# Patient Record
Sex: Female | Born: 1950 | Race: White | Hispanic: No | Marital: Single | Smoking: Never smoker
Health system: Southern US, Community
[De-identification: ages and names within clinical notes are randomized; demographics above are authoritative.]

## PROBLEM LIST (undated history)

## (undated) DIAGNOSIS — Z9109 Other allergy status, other than to drugs and biological substances: Secondary | ICD-10-CM

---

## 2014-03-22 ENCOUNTER — Encounter (HOSPITAL_BASED_OUTPATIENT_CLINIC_OR_DEPARTMENT_OTHER): Payer: Self-pay | Admitting: Emergency Medicine

## 2014-03-22 ENCOUNTER — Emergency Department (HOSPITAL_BASED_OUTPATIENT_CLINIC_OR_DEPARTMENT_OTHER): Payer: BC Managed Care – PPO

## 2014-03-22 ENCOUNTER — Emergency Department (HOSPITAL_BASED_OUTPATIENT_CLINIC_OR_DEPARTMENT_OTHER)
Admission: EM | Admit: 2014-03-22 | Discharge: 2014-03-23 | Disposition: A | Discharge status: Home / Self Care | Payer: BC Managed Care – PPO | Attending: Emergency Medicine | Admitting: Emergency Medicine

## 2014-03-22 DIAGNOSIS — S93401A Sprain of unspecified ligament of right ankle, initial encounter: Secondary | ICD-10-CM

## 2014-03-22 DIAGNOSIS — Z880 Allergy status to penicillin: Secondary | ICD-10-CM | POA: Insufficient documentation

## 2014-03-22 DIAGNOSIS — W1809XA Striking against other object with subsequent fall, initial encounter: Secondary | ICD-10-CM | POA: Insufficient documentation

## 2014-03-22 DIAGNOSIS — S9030XA Contusion of unspecified foot, initial encounter: Secondary | ICD-10-CM | POA: Insufficient documentation

## 2014-03-22 DIAGNOSIS — S93409A Sprain of unspecified ligament of unspecified ankle, initial encounter: Secondary | ICD-10-CM | POA: Insufficient documentation

## 2014-03-22 DIAGNOSIS — Y929 Unspecified place or not applicable: Secondary | ICD-10-CM | POA: Insufficient documentation

## 2014-03-22 DIAGNOSIS — W102XXA Fall (on)(from) incline, initial encounter: Secondary | ICD-10-CM

## 2014-03-22 DIAGNOSIS — IMO0002 Reserved for concepts with insufficient information to code with codable children: Secondary | ICD-10-CM | POA: Insufficient documentation

## 2014-03-22 DIAGNOSIS — S40019A Contusion of unspecified shoulder, initial encounter: Secondary | ICD-10-CM | POA: Insufficient documentation

## 2014-03-22 DIAGNOSIS — T148XXA Other injury of unspecified body region, initial encounter: Secondary | ICD-10-CM

## 2014-03-22 DIAGNOSIS — W108XXA Fall (on) (from) other stairs and steps, initial encounter: Secondary | ICD-10-CM | POA: Insufficient documentation

## 2014-03-22 DIAGNOSIS — X500XXA Overexertion from strenuous movement or load, initial encounter: Secondary | ICD-10-CM | POA: Insufficient documentation

## 2014-03-22 DIAGNOSIS — Z8709 Personal history of other diseases of the respiratory system: Secondary | ICD-10-CM | POA: Insufficient documentation

## 2014-03-22 DIAGNOSIS — Z79899 Other long term (current) drug therapy: Secondary | ICD-10-CM | POA: Insufficient documentation

## 2014-03-22 DIAGNOSIS — Y9389 Activity, other specified: Secondary | ICD-10-CM | POA: Insufficient documentation

## 2014-03-22 HISTORY — DX: Other allergy status, other than to drugs and biological substances: Z91.09

## 2014-03-22 NOTE — ED Notes (Signed)
Patient transported to X-ray 

## 2014-03-22 NOTE — ED Notes (Signed)
Pt was at daughters wedding dress rehersal, lost balance and fell down 2 steps, hit right side of forehead, denies loc, injured right ankle, bilateral knees, right shoulder

## 2014-03-23 ENCOUNTER — Emergency Department (HOSPITAL_BASED_OUTPATIENT_CLINIC_OR_DEPARTMENT_OTHER): Payer: BC Managed Care – PPO

## 2014-03-23 MED ORDER — ONDANSETRON 8 MG PO TBDP
8.0000 mg | ORAL_TABLET | Freq: Once | ORAL | Status: AC
  Administered 2014-03-23: 8 mg via ORAL
  Filled 2014-03-23: qty 1

## 2014-03-23 MED ORDER — OXYCODONE-ACETAMINOPHEN 5-325 MG PO TABS: 1.0000 | ORAL_TABLET | Freq: Four times a day (QID) | ORAL | Status: AC | PRN

## 2014-03-23 MED ORDER — OXYCODONE-ACETAMINOPHEN 5-325 MG PO TABS
2.0000 | ORAL_TABLET | Freq: Once | ORAL | Status: AC
Start: 2014-03-23 — End: 2014-03-23
  Administered 2014-03-23: 2 via ORAL
  Filled 2014-03-23: qty 2

## 2014-03-23 MED ORDER — NAPROXEN 500 MG PO TABS: 500.0000 mg | ORAL_TABLET | Freq: Two times a day (BID) | ORAL | Status: AC

## 2014-03-23 NOTE — ED Notes (Signed)
MD at bedside. 

## 2014-03-23 NOTE — ED Provider Notes (Signed)
CSN: [id number]     Arrival date & time 03/22/14  2332 History   First MD Initiated Contact with Patient 03/22/14 2358     Chief Complaint  Patient presents with  . Fall     (Consider location/radiation/quality/duration/timing/severity/associated sxs/prior Treatment) Patient is a [age] y.o. female presenting with fall. The history is provided by the patient.  Fall This is a new problem. The current episode started 3 to 5 hours ago. The problem occurs constantly. The problem has not changed since onset.Pertinent negatives include no headaches. Nothing aggravates the symptoms. Nothing relieves the symptoms. She has tried nothing for the symptoms. The treatment provided no relief.  On platform at church for daughter's rehearsal and missed a step and went down striking right shoulder and twisting right ankle.  No LOC no vomiting  Past Medical History  Diagnosis Date  . Environmental allergies    History reviewed. No pertinent past surgical history. History reviewed. No pertinent family history. History  Substance Use Topics  . Smoking status: Never Smoker   . Smokeless tobacco: Not on file  . Alcohol Use: No   OB History   Grav Para Term Preterm Abortions TAB SAB Ect Mult Living                 Review of Systems  Gastrointestinal: Negative for vomiting.  Musculoskeletal: Positive for arthralgias. Negative for back pain and neck pain.  Neurological: Negative for weakness, numbness and headaches.  All other systems reviewed and are negative.     Allergies  Penicillins  Home Medications   Prior to Admission medications   Medication Sig Start Date End Date Taking? Authorizing Provider  cetirizine (ZYRTEC) 10 MG tablet Take 10 mg by mouth daily.   Yes Historical Provider, MD  ibuprofen (ADVIL,MOTRIN) 600 MG tablet Take 600 mg by mouth every 6 (six) hours as needed.   Yes Historical Provider, MD  predniSONE (STERAPRED UNI-PAK) 10 MG tablet Take by mouth daily.   Yes Historical  Provider, MD  naproxen (NAPROSYN) 500 MG tablet Take 1 tablet (500 mg total) by mouth 2 (two) times daily. 03/23/14   April Smitty CordsK Palumbo-Rasch, MD  oxyCODONE-acetaminophen (PERCOCET) 5-325 MG per tablet Take 1 tablet by mouth every 6 (six) hours as needed for severe pain. 03/23/14   April K Palumbo-Rasch, MD   BP 112/66  Pulse 76  Temp(Src) 97.6 F (36.4 C) (Oral)  Resp 20  Ht 5\' 6"  (1.676 m)  Wt 130 lb (58.968 kg)  BMI 20.99 kg/m2  SpO2 100% Physical Exam  Constitutional: She is oriented to person, place, and time. She appears well-developed and well-nourished.  HENT:  Head: Normocephalic and atraumatic. Head is without raccoon's eyes and without Battle's sign.  Right Ear: No mastoid tenderness. No hemotympanum.  Left Ear: No mastoid tenderness. No hemotympanum.  Mouth/Throat: Oropharynx is clear and moist. No oropharyngeal exudate.  Eyes: Conjunctivae are normal. Pupils are equal, round, and reactive to light.  Neck: Normal range of motion. Neck supple.  No c spine tenderness  Cardiovascular: Normal rate, regular rhythm and intact distal pulses.   Pulmonary/Chest: Effort normal and breath sounds normal. She has no wheezes. She has no rales.  Abdominal: Soft. Bowel sounds are normal. There is no tenderness. There is no rebound and no guarding.  Musculoskeletal: Normal range of motion.  Hematoma of dorsum of the right foot, R foot NVI with cap refill < 2 sec.  FROM achilles intact.  Pain over the anterior talar ligament of the r  foot.  Contusion of the R shoulder FROM 5/5 of the RUE intact DTRs negative neers tests of the R shoulder  Neurological: She is alert and oriented to person, place, and time. She has normal reflexes.  Skin: Skin is warm.  Psychiatric: She has a normal mood and affect.    ED Course  Procedures (including critical care time) Labs Review Labs Reviewed - No data to display  Imaging Review Dg Shoulder Right  03/23/2014   CLINICAL DATA:  Fall with right shoulder  pain  EXAM: RIGHT SHOULDER - 2+ VIEW  COMPARISON:  None.  FINDINGS: There is no evidence of fracture or dislocation. No significant degenerative change to the glenohumeral or acromioclavicular joints.  IMPRESSION: Negative.   Electronically Signed   By: Tiburcio PeaJonathan  Watts M.D.   On: 03/23/2014 01:31   Dg Ankle Complete Right  03/23/2014   CLINICAL DATA:  Swelling and red streaking  EXAM: RIGHT ANKLE - COMPLETE 3+ VIEW  COMPARISON:  None.  FINDINGS: Nonspecific soft tissue swelling about the ankle. There is no evidence of fracture, malalignment, or osseous infection. Osteopenia which is generalized. No subcutaneous emphysema. Minimal ossification in the region of the mid Achilles.  IMPRESSION: Soft tissue swelling without radiopaque foreign body, subcutaneous gas, or evidence of osseous infection.   Electronically Signed   By: Tiburcio PeaJonathan  Watts M.D.   On: 03/23/2014 00:30   Dg Foot Complete Right  03/23/2014   CLINICAL DATA:  Right foot pain after fall.  EXAM: RIGHT FOOT COMPLETE - 3+ VIEW  COMPARISON:  None.  FINDINGS: Status post surgical fusion of the first metatarsophalangeal joint. No fracture or dislocation is noted. Diffuse osteopenia is noted. There appears to be chronic resorption of the distal portions of the second, third, fourth and fifth metatarsals which may represent some form of arthritis or postsurgical change.  IMPRESSION: No acute abnormality seen in the right foot. Chronic findings as described above.   Electronically Signed   By: Roque LiasJames  Green M.D.   On: 03/23/2014 01:31     EKG Interpretation None      MDM   Final diagnoses:  Hematoma  Ankle sprain, right, initial encounter  Fall (on)(from) incline, initial encounter    Ankle sprain and hematoma ASO applied with crutches ICe and elevate the leg.  Instructions given RX for percocet and naproxen.      Jasmine AweApril K Palumbo-Rasch, MD 03/23/14 [id number]

## 2014-03-23 NOTE — ED Notes (Signed)
Pt vomited small amount of material, zofran on board

## 2014-03-23 NOTE — Discharge Instructions (Signed)
Acute Ankle Sprain with Phase II Rehab An acute ankle sprain is a partial or complete tear in one or more of the ligaments of the ankle due to traumatic injury. The severity of the injury depends on both the number of ligaments sprained and the grade of sprain. There are 3 grades of sprains.  A grade 1 sprain is a mild sprain. There is a slight pull without obvious tearing. There is no loss of strength, and the muscle and ligament are the correct length.  A grade 2 sprain is a moderate sprain. There is tearing of fibers within the substance of the ligament where it connects two bones or two cartilages. The length of the ligament is increased, and there is usually decreased strength.  A grade 3 sprain is a complete rupture of the ligament and is uncommon. In addition to the grade of sprain, there are 3 types of ankle sprains.  Lateral ankle sprains. This is a sprain of one or more of the 3 ligaments on the outer side (lateral) of the ankle. These are the most common sprains. Medial ankle sprains. There is one large triangular ligament on the inner side (medial) of the ankle that is susceptible to injury. Medial ankle sprains are less common. Syndesmosis, "high ankle," sprains. The syndesmosis is the ligament that connects the two bones of the lower leg. Syndesmosis sprains usually only occur with very severe ankle sprains. SYMPTOMS  Pain, tenderness, and swelling in the ankle, starting at the side of injury that may progress to the whole ankle and foot with time.  "Pop" or tearing sensation at the time of injury.  Bruising that may spread to the heel.  Impaired ability to walk soon after injury. CAUSES   Acute ankle sprains are caused by trauma placed on the ankle that temporarily forces or pries the anklebone (talus) out of its normal socket.  Stretching or tearing of the ligaments that normally hold the joint in place (usually due to a twisting injury). RISK INCREASES WITH:  Previous  ankle sprain.  Sports in which the foot may land awkwardly (basketball, volleyball, soccer) or walking or running on uneven or rough surfaces.  Shoes with inadequate support to prevent sideways motion when stress occurs.  Poor strength and flexibility.  Poor balance skills.  Contact sports. PREVENTION  Warm up and stretch properly before activity.  Maintain physical fitness:  Ankle and leg flexibility, muscle strength, and endurance.  Cardiovascular fitness.  Balance training activities.  Use proper technique and have a coach correct improper technique.  Taping, protective strapping, bracing, or high-top tennis shoes may help prevent injury. Initially, tape is best. However, it loses most of its support function within 10 to 15 minutes.  Wear proper fitted protective shoes. Combining high-top shoes with taping or bracing is more effective than using either alone.  Provide the ankle with support during sports and practice activities for 12 months following injury. PROGNOSIS   If treated properly, ankle sprains can be expected to recover completely. However, the length of recovery depends on the degree of injury.  A grade 1 sprain usually heals enough in 5 to 7 days to allow modified activity and requires an average of 6 weeks to heal completely.  A grade 2 sprain requires 6 to 10 weeks to heal completely.  A grade 3 sprain requires 12 to 16 weeks to heal.  A syndesmosis sprain often takes more than 3 months to heal. RELATED COMPLICATIONS   Frequent recurrence of symptoms may result   in a chronic problem. Appropriately addressing the problem the first time decreases the frequency of recurrence and optimizes healing time. Severity of initial sprain does not predict the likelihood of later instability.  Injury to other structures (bone, cartilage, or tendon).  Chronically unstable or arthritic ankle joint are possible with repeated sprains. TREATMENT Treatment initially  involves the use of ice, medicine, and compression bandages to help reduce pain and inflammation. Ankle sprains are usually immobilized in a walking cast or boot to allow for healing. Crutches may be recommended to reduce pressure on the injury. After immobilization, strengthening and stretching exercises may be necessary to regain strength and a full range of motion. Surgery is rarely needed to treat ankle sprains. MEDICATION   Nonsteroidal anti-inflammatory medicines, such as aspirin and ibuprofen (do not take for the first 3 days after injury or within 7 days before surgery), or other minor pain relievers, such as acetaminophen, are often recommended. Take these as directed by your caregiver. Contact your caregiver immediately if any bleeding, stomach upset, or signs of an allergic reaction occur from these medicines.  Ointments applied to the skin may be helpful.  Pain relievers may be prescribed as necessary by your caregiver. Do not take prescription pain medicine for longer than 4 to 7 days. Use only as directed and only as much as you need. HEAT AND COLD  Cold treatment (icing) is used to relieve pain and reduce inflammation for acute and chronic cases. Cold should be applied for 10 to 15 minutes every 2 to 3 hours for inflammation and pain and immediately after any activity that aggravates your symptoms. Use ice packs or an ice massage.  Heat treatment may be used before performing stretching and strengthening activities prescribed by your caregiver. Use a heat pack or a warm soak. SEEK IMMEDIATE MEDICAL CARE IF:   Pain, swelling, or bruising worsens despite treatment.  You experience pain, numbness, discoloration, or coldness in the foot or toes.  New, unexplained symptoms develop. (Drugs used in treatment may produce side effects.) EXERCISES  PHASE II EXERCISES RANGE OF MOTION (ROM) AND STRETCHING EXERCISES - Ankle Sprain, Acute-Phase II, Weeks 3 to 4 After your physician, physical  therapist, or athletic trainer feels your knee has made progress significant enough to begin more advanced exercises, he or she may recommend completing some of the following exercises. Although each person heals at different rates, most people will be ready for these exercises between 3 and 4 weeks after their injury. Do not begin these exercises until you have your caregiver's permission. He or she may also advise you to continue with the exercises which you completed in Phase I of your rehabilitation. While completing these exercises, remember:   Restoring tissue flexibility helps normal motion to return to the joints. This allows healthier, less painful movement and activity.  An effective stretch should be held for at least 30 seconds.  A stretch should never be painful. You should only feel a gentle lengthening or release in the stretched tissue. RANGE OF MOTION - Ankle Plantar Flexion   Sit with your right / left leg crossed over your opposite knee.  Use your opposite hand to pull the top of your foot and toes toward you.  You should feel a gentle stretch on the top of your foot/ankle. Hold this position for __________. Repeat __________ times. Complete __________ times per day.  RANGE OF MOTION - Ankle Eversion  Sit with your right / left ankle crossed over your opposite knee.    Grip your foot with your opposite hand, placing your thumb on the top of your foot and your fingers across the bottom of your foot.  Gently push your foot downward with a slight rotation so your littlest toes rise slightly  You should feel a gentle stretch on the inside of your ankle. Hold the stretch for __________ seconds. Repeat __________ times. Complete this exercise __________ times per day.  RANGE OF MOTION - Ankle Inversion  Sit with your right / left ankle crossed over your opposite knee.  Grip your foot with your opposite hand, placing your thumb on the bottom of your foot and your fingers across  the top of your foot.  Gently pull your foot so the smallest toe comes toward you and your thumb pushes the inside of the ball of your foot away from you.  You should feel a gentle stretch on the outside of your ankle. Hold the stretch for __________ seconds. Repeat __________ times. Complete this exercise __________ times per day.  STRETCH - Gastrocsoleus  Sit with your right / left leg extended. Holding onto both ends of a belt or towel, loop it around the ball of your foot.  Keeping your right / left ankle and foot relaxed and your knee straight, pull your foot and ankle toward you using the belt/towel.  You should feel a gentle stretch behind your calf or knee. Hold this position for __________ seconds. Repeat __________ times. Complete this stretch __________ times per day.  RANGE OF MOTION - Ankle Dorsiflexion, Active Assisted  Remove shoes and sit on a chair that is preferably not on a carpeted surface.  Place right / left foot under knee. Extend your opposite leg for support.  Keeping your heel down, slide your right / left foot back toward the chair until you feel a stretch at your ankle or calf. If you do not feel a stretch, slide your bottom forward to the edge of the chair while still keeping your heel down.  Hold this stretch for __________ seconds. Repeat __________ times. Complete this stretch __________ times per day.  STRETCH - Gastroc, Standing   Place hands on wall.  Extend right / left leg and place a folded washcloth under the arch of your foot for support. Keep the front knee somewhat bent.  Slightly point your toes inward on your back foot.  Keeping your right / left heel on the floor and your knee straight, shift your weight toward the wall, not allowing your back to arch.  You should feel a gentle stretch in the calf. Hold this position for __________ seconds. Repeat __________ times. Complete this stretch __________ times per day. STRETCH - Soleus,  Standing  Place hands on wall.  Extend right / left leg and place a folded washcloth under the arch of your foot for support. Keep the front knee somewhat bent.  Slightly point your toes inward on your back foot.  Keep your right / left heel on the floor, bend your back knee, and slightly shift your weight over the back leg so that you feel a gentle stretch deep in your back calf.  Hold this position for __________ seconds. Repeat __________ times. Complete this stretch __________ times per day. STRETCH - Gastrocsoleus, Standing Note: This exercise can place a lot of stress on your foot and ankle. Please complete this exercise only if specifically instructed by your caregiver.   Place the ball of your right / left foot on a step, keeping your other   foot firmly on the same step.  Hold on to the wall or a rail for balance.  Slowly lift your other foot, allowing your body weight to press your heel down over the edge of the step.  You should feel a stretch in your right / left calf.  Hold this position for __________ seconds.  Repeat this exercise with a slight bend in your knee. Repeat __________ times. Complete this stretch __________ times per day.  STRENGTHENING EXERCISES - Ankle Sprain, Acute-Phase II Around 3 to 4 weeks after your injury, you may progress to some of these exercises in your rehabilitation program. Do not begin these until you have your caregiver's permission. Although your condition has improved, the Phase I exercises will continue to be helpful and you may continue to complete them. As you complete strengthening exercises, remember:   Strong muscles with good endurance tolerate stress better.  Do the exercises as initially prescribed by your caregiver. Progress slowly with each exercise, gradually increasing the number of repetitions and weight used under his or her guidance.  You may experience muscle soreness or fatigue, but the pain or discomfort you are trying  to eliminate should never worsen during these exercises. If this pain does worsen, stop and make certain you are following the directions exactly. If the pain is still present after adjustments, discontinue the exercise until you can discuss the trouble with your caregiver. STRENGTH - Plantar-flexors, Standing  Stand with your feet shoulder width apart. Steady yourself with a wall or table using as little support as needed.  Keeping your weight evenly spread over the width of your feet, rise up on your toes.*  Hold this position for __________ seconds. Repeat __________ times. Complete this exercise __________ times per day.  *If this is too easy, shift your weight toward your right / left leg until you feel challenged. Ultimately, you may be asked to do this exercise with your right / left foot only. STRENGTH - Dorsiflexors and Plantar-flexors, Heel/toe Walking  Dorsiflexion: Walk on your heels only. Keep your toes as high as possible.  Walk for ____________________ seconds/feet.  Repeat __________ times. Complete __________ times per day.  Plantar flexion: Walk on your toes only. Keep your heels as high as possible.  Walk for ____________________ seconds/feet. Repeat __________ times. Complete __________ times per day.  BALANCE - Tandem Walking  Place your uninjured foot on a line 2 to 4 inches wide and at least 10 feet long.  Keeping your balance without using anything for extra support, place your right / left heel directly in front of your other foot.  Slowly raise your back foot up, lifting from the heel to the toes, and place it directly in front of the right / left foot.  Continue to walk along the line slowly. Walk for ____________________ feet. Repeat ____________________ times. Complete ____________________ times per day. BALANCE - Inversion/Eversion Use caution, these are advanced level exercises. Do not begin them until you are advised to do so.   Create a balance  board using a sturdy board about 1  feet long and at 1 to 1  feet wide and a 1  inch diameter rod or pipe that is as long as the board's width. A copper pipe or a solid broomstick work well.  Stand on a non-carpeted surface near a countertop or wall. Step onto the board so that your feet are hip-width apart and equally straddle the rod/pipe.  Keeping your feet in place, complete these two exercises   without shifting your upper body or hips:  Tip the board from side-to-side. Control the movement so the board does not forcefully strike the ground. The board should silently tap the ground.  Tip the board side-to-side without striking the ground. Occasionally pause and maintain a steady position at various points.  Repeat the first two exercises, but use only your right / left foot. Place your right / left foot directly over the rod/pipe. Repeat __________ times. Complete this exercise __________ times a day. BALANCE - Plantar/Dorsi Flexion Use caution, these are advanced level exercises. Do not begin them until you are advised to do so.   Create a balance board using a sturdy board about 1  feet long and at 1 to 1  feet wide and a 1  inch diameter rod or pipe that is as long as the board's width. A copper pipe or a solid broomstick work well.  Stand on a non-carpeted surface near a countertop or wall. Stand on the board so that the rod/pipe runs under the arches in your feet.  Keeping your feet in place, complete these two exercises without shifting your upper body or hips:  Tip the board from side-to-side. Control the movement so the board does not forcefully strike the ground. The board should silently tap the ground.  Tip the board side-to-side without striking the ground. Occasionally pause and maintain a steady position at various points.  Repeat the first two exercises, but use only your right / left foot. Stand in the center of the board. Repeat __________ times. Complete this  exercise __________ times a day. STRENGTH - Plantar-flexors, Eccentric Note: This exercise can place a lot of stress on your foot and ankle. Please complete this exercise only if specifically instructed by your caregiver.   Place the balls of your feet on a step. With your hands, use only enough support from a wall or rail to keep your balance.  Keep your knees straight and rise up on your toes.  Slowly shift your weight entirely to your toes and pick up your opposite foot. Gently and with controlled movement, lower your weight through your right / left foot so that your heel drops below the level of the step. You will feel a slight stretch in the back of your calf at the ending position.  Use the healthy leg to help rise up onto the balls of both feet, then lower weight only on the right / left leg again. Build up to 15 repetitions. Then progress to 3 consecutive sets of 15 repetitions.*  After completing the above exercise, complete the same exercise with a slight knee bend (about 30 degrees). Again, build up to 15 repetitions. Then progress to 3 consecutive sets of 15 repetitions.* Perform this exercise __________ times per day.  *When you easily complete 3 sets of 15, your physician, physical therapist, or athletic trainer may advise you to add resistance by wearing a backpack filled with additional weight. Document Released: 01/03/2006 Document Revised: 12/06/2011 Document Reviewed: 12/26/2008 [employer] Patient Information 2015 [employer]. This information is not intended to replace advice given to you by your health care provider. Make sure you discuss any questions you have with your health care provider.  

## 2015-05-05 IMAGING — CR DG SHOULDER 2+V*R*
2 series · 2 of 2 positions shown · non-contrast
Comparison: None.

CLINICAL DATA: Fall with right shoulder pain

EXAM:
RIGHT SHOULDER - 2+ VIEW

[t shoulder ap internal righ]
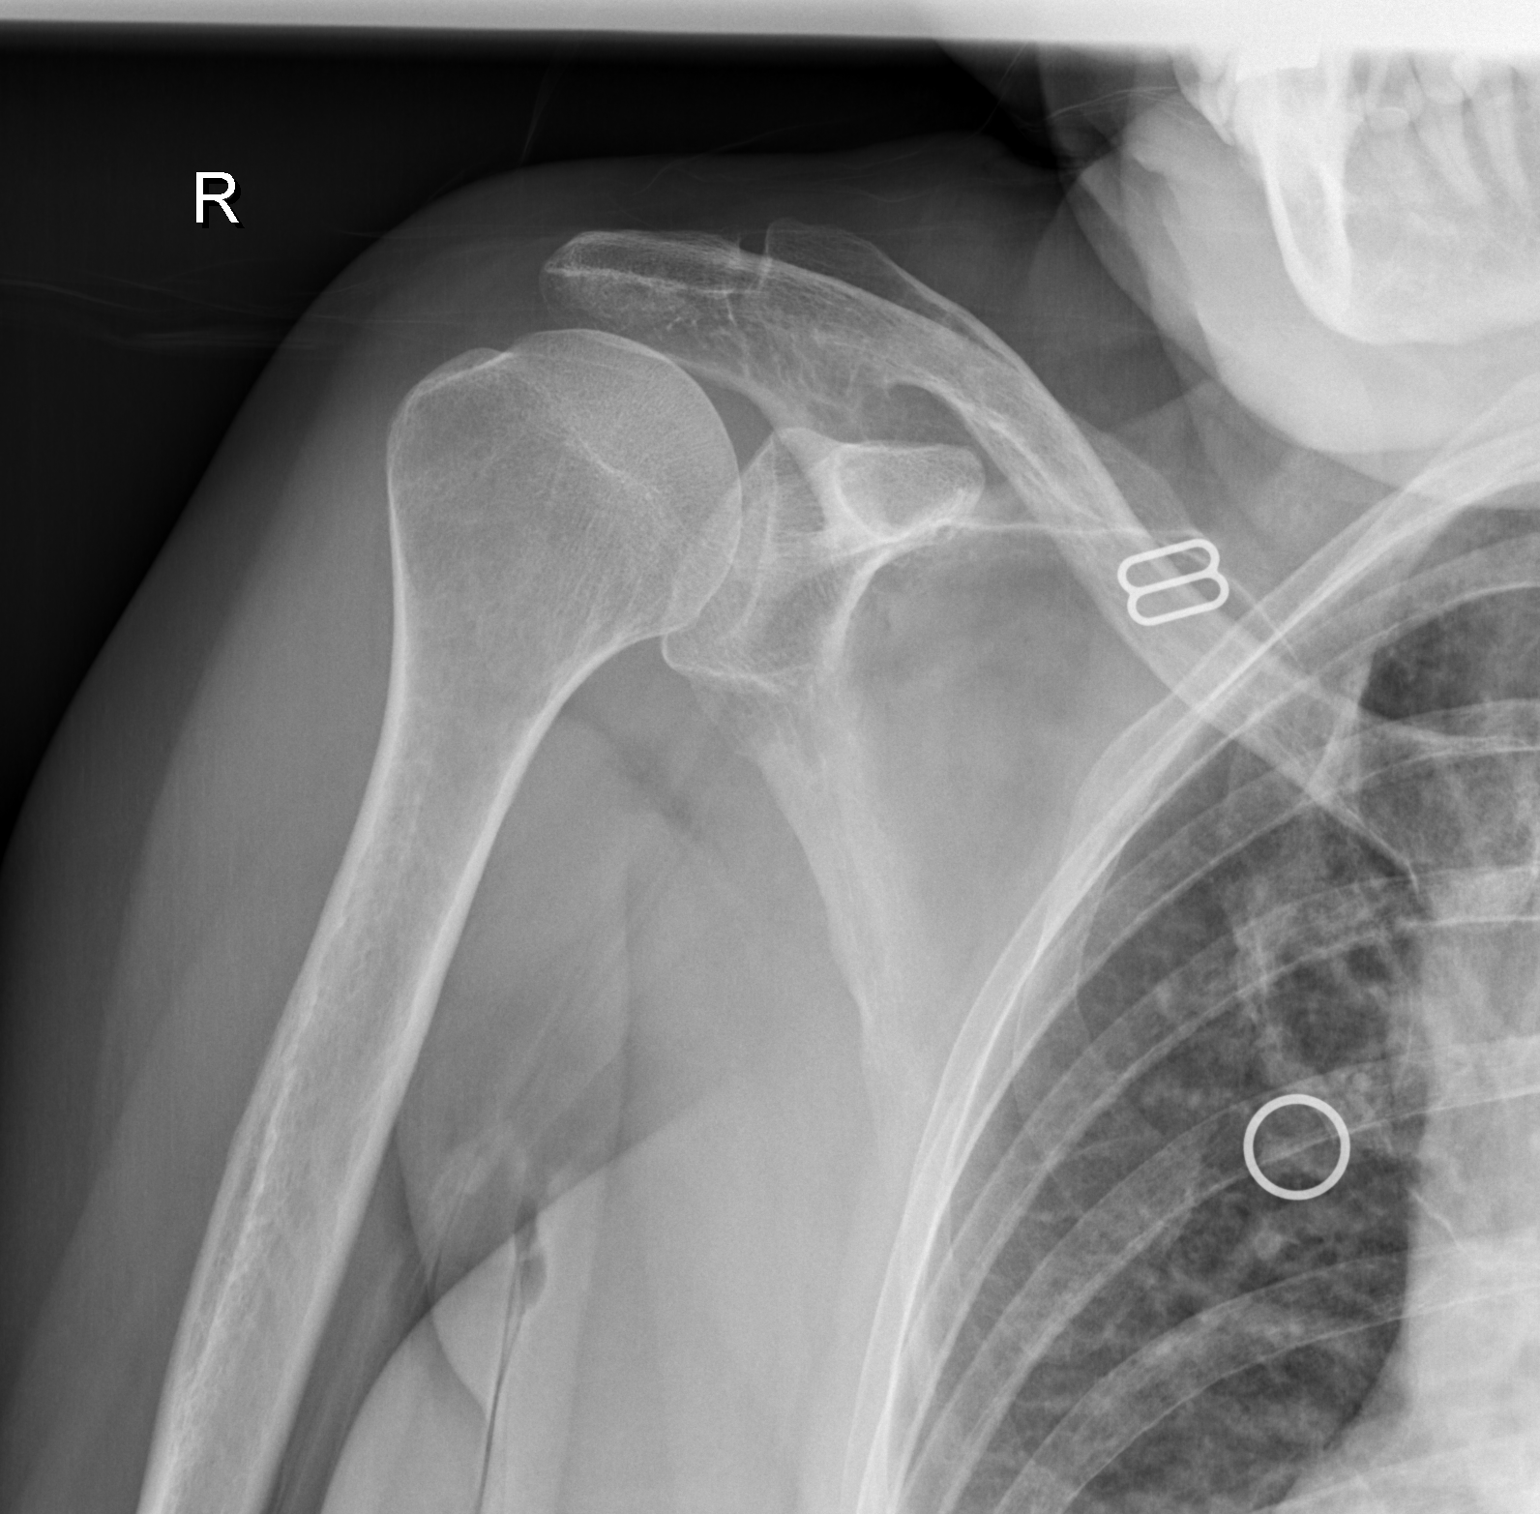

[t shoulder ap external righ]
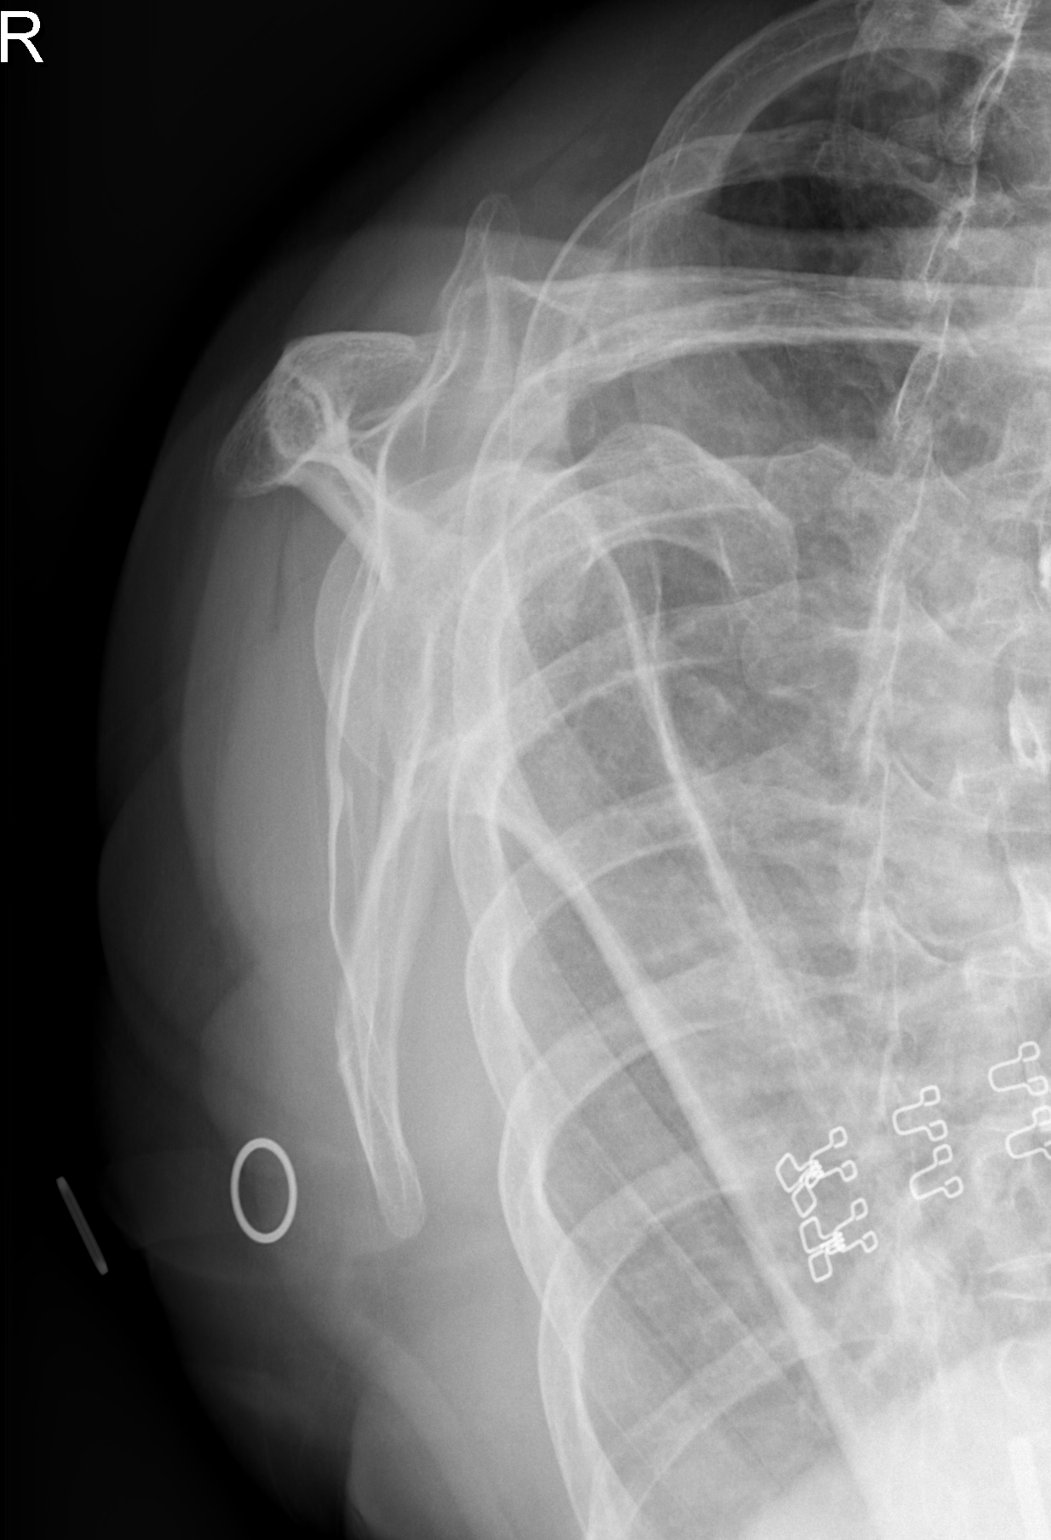

[2 of 2 positions shown; findings below may reference images not displayed]

FINDINGS: There is no evidence of fracture or dislocation. No significant
degenerative change to the glenohumeral or acromioclavicular joints.
IMPRESSION: Negative.

## 2015-05-05 IMAGING — CR DG ANKLE COMPLETE 3+V*R*
3 series · 3 of 3 positions shown · non-contrast
Comparison: None.

CLINICAL DATA: Swelling and red streaking

EXAM:
RIGHT ANKLE - COMPLETE 3+ VIEW

[t ankle joint ap right]
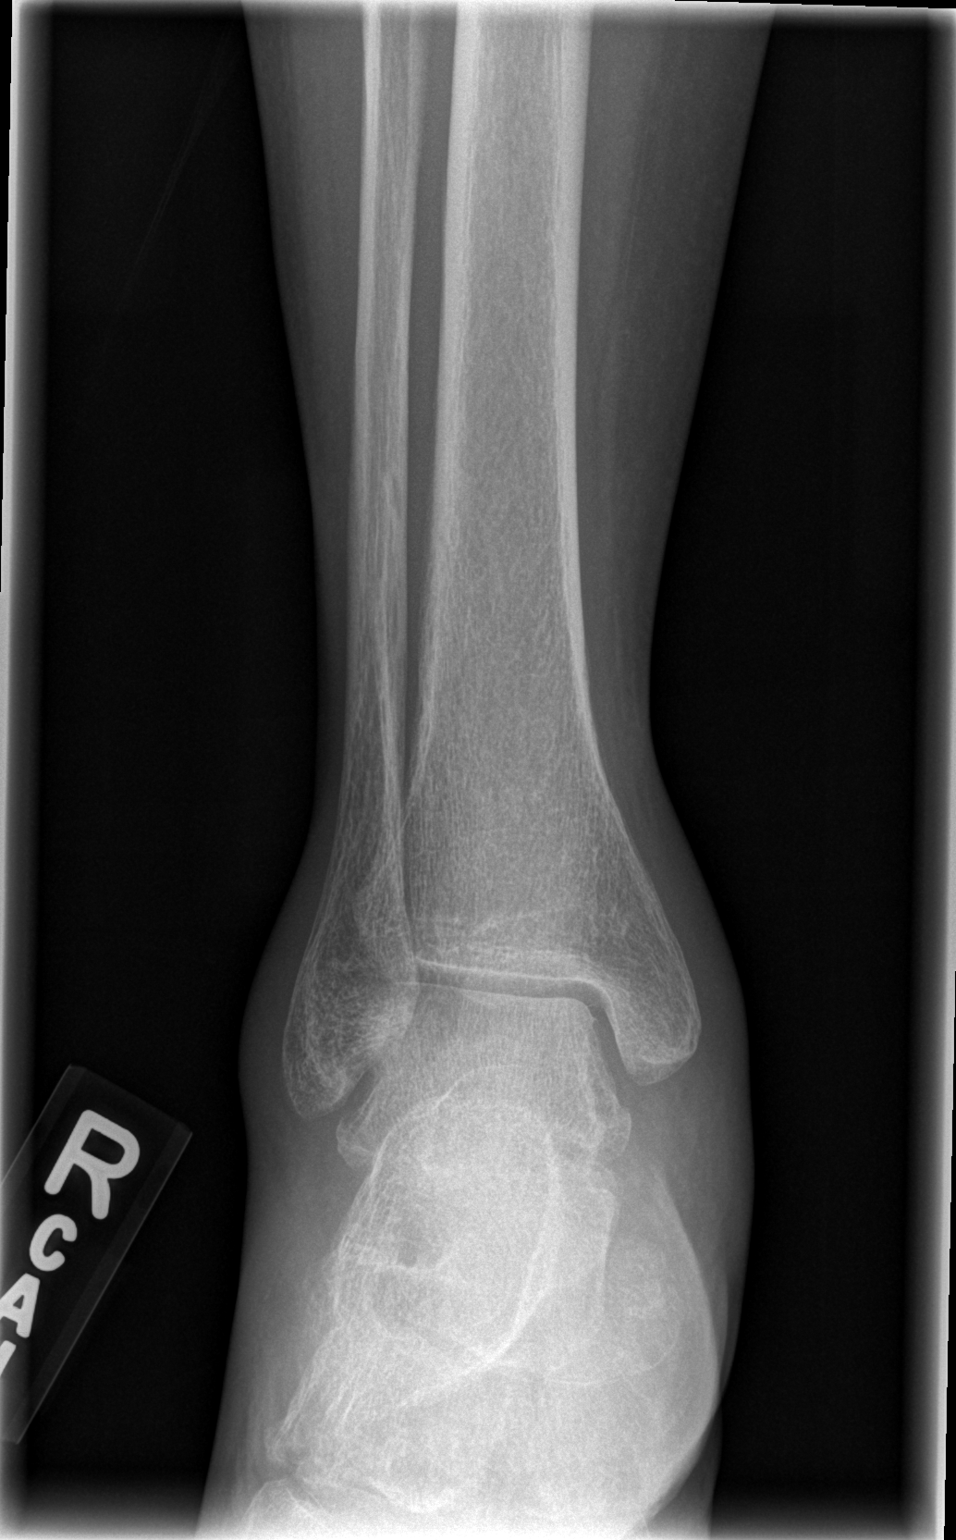

[t ankle joint oblique right]
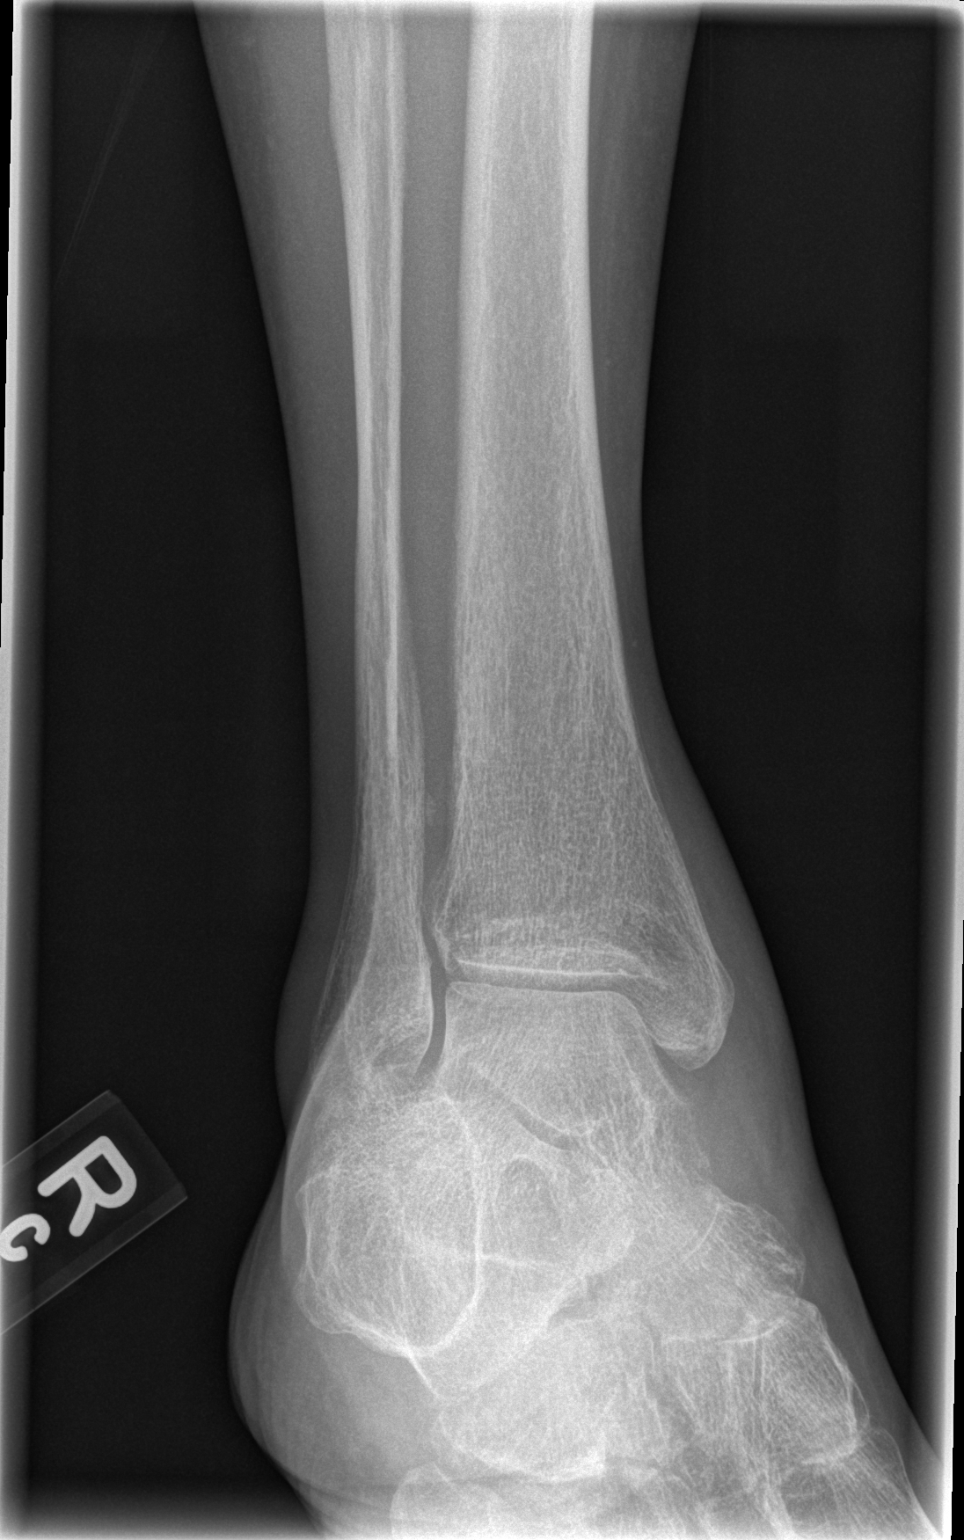

[t ankle joint lat right]
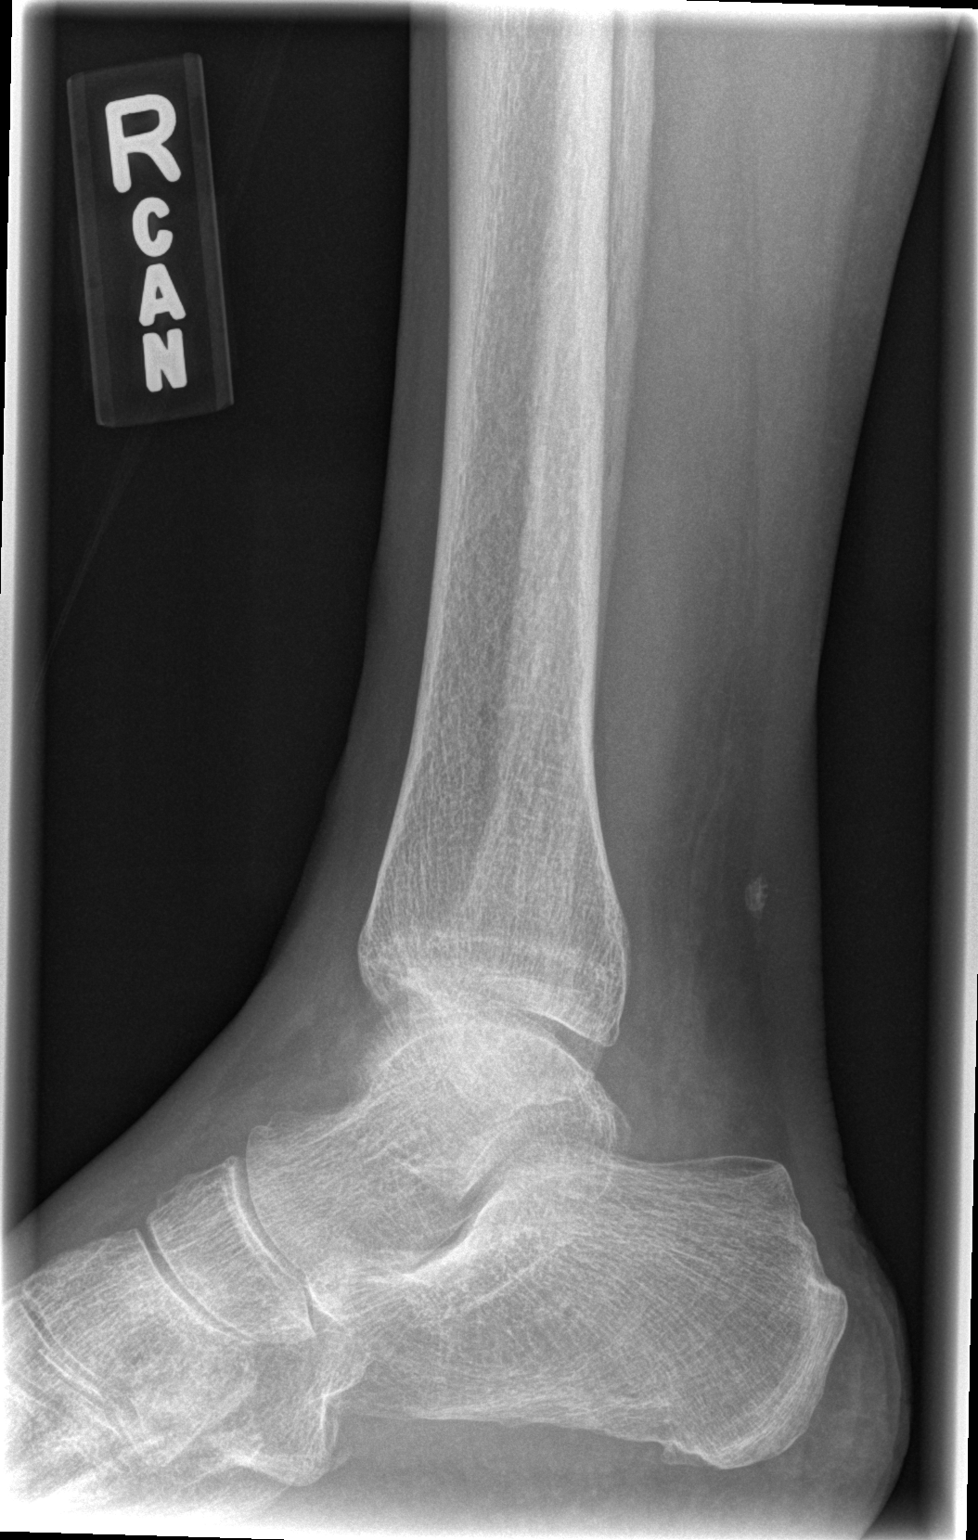

[3 of 3 positions shown; findings below may reference images not displayed]

FINDINGS: Nonspecific soft tissue swelling about the ankle. There is no
evidence of fracture, malalignment, or osseous infection. Osteopenia
which is generalized. No subcutaneous emphysema. Minimal
ossification in the region of the mid Achilles.
IMPRESSION: Soft tissue swelling without radiopaque foreign body, subcutaneous
gas, or evidence of osseous infection.
